# Patient Record
Sex: Female | Born: 1955 | Race: White | Hispanic: No | Marital: Single | Smoking: Never smoker
Health system: Southern US, Community
[De-identification: ages and names within clinical notes are randomized; demographics above are authoritative.]

## PROBLEM LIST (undated history)

## (undated) DIAGNOSIS — C801 Malignant (primary) neoplasm, unspecified: Secondary | ICD-10-CM

## (undated) DIAGNOSIS — R011 Cardiac murmur, unspecified: Secondary | ICD-10-CM

## (undated) DIAGNOSIS — K219 Gastro-esophageal reflux disease without esophagitis: Secondary | ICD-10-CM

## (undated) DIAGNOSIS — A389 Scarlet fever, uncomplicated: Secondary | ICD-10-CM

## (undated) DIAGNOSIS — E785 Hyperlipidemia, unspecified: Secondary | ICD-10-CM

## (undated) DIAGNOSIS — E079 Disorder of thyroid, unspecified: Secondary | ICD-10-CM

## (undated) DIAGNOSIS — F329 Major depressive disorder, single episode, unspecified: Secondary | ICD-10-CM

## (undated) DIAGNOSIS — B019 Varicella without complication: Secondary | ICD-10-CM

## (undated) DIAGNOSIS — F32A Depression, unspecified: Secondary | ICD-10-CM

## (undated) DIAGNOSIS — M1990 Unspecified osteoarthritis, unspecified site: Secondary | ICD-10-CM

## (undated) DIAGNOSIS — K5792 Diverticulitis of intestine, part unspecified, without perforation or abscess without bleeding: Secondary | ICD-10-CM

## (undated) DIAGNOSIS — T7840XA Allergy, unspecified, initial encounter: Secondary | ICD-10-CM

## (undated) HISTORY — DX: Gastro-esophageal reflux disease without esophagitis: K21.9

## (undated) HISTORY — DX: Major depressive disorder, single episode, unspecified: F32.9

## (undated) HISTORY — DX: Scarlet fever, uncomplicated: A38.9

## (undated) HISTORY — DX: Malignant (primary) neoplasm, unspecified: C80.1

## (undated) HISTORY — DX: Disorder of thyroid, unspecified: E07.9

## (undated) HISTORY — DX: Cardiac murmur, unspecified: R01.1

## (undated) HISTORY — DX: Diverticulitis of intestine, part unspecified, without perforation or abscess without bleeding: K57.92

## (undated) HISTORY — DX: Varicella without complication: B01.9

## (undated) HISTORY — DX: Allergy, unspecified, initial encounter: T78.40XA

## (undated) HISTORY — DX: Depression, unspecified: F32.A

## (undated) HISTORY — DX: Unspecified osteoarthritis, unspecified site: M19.90

## (undated) HISTORY — DX: Hyperlipidemia, unspecified: E78.5

---
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
## 2002-06-13 HISTORY — PX: BREAST SURGERY: SHX581

## 2016-11-11 HISTORY — PX: NOSE SURGERY: SHX723

## 2017-08-02 ENCOUNTER — Ambulatory Visit: Payer: BLUE CROSS/BLUE SHIELD | Admitting: Family Medicine

## 2017-08-02 ENCOUNTER — Encounter: Payer: Self-pay | Admitting: Family Medicine

## 2017-08-02 VITALS — BP 126/83 | HR 73 | Temp 98.2°F | Resp 12 | Ht 65.0 in | Wt 214.2 lb

## 2017-08-02 DIAGNOSIS — Z6835 Body mass index (BMI) 35.0-35.9, adult: Secondary | ICD-10-CM

## 2017-08-02 DIAGNOSIS — F39 Unspecified mood [affective] disorder: Secondary | ICD-10-CM

## 2017-08-02 DIAGNOSIS — M545 Low back pain: Secondary | ICD-10-CM

## 2017-08-02 DIAGNOSIS — G8929 Other chronic pain: Secondary | ICD-10-CM | POA: Diagnosis not present

## 2017-08-02 DIAGNOSIS — E6609 Other obesity due to excess calories: Secondary | ICD-10-CM | POA: Diagnosis not present

## 2017-08-02 DIAGNOSIS — G4700 Insomnia, unspecified: Secondary | ICD-10-CM | POA: Diagnosis not present

## 2017-08-02 DIAGNOSIS — E669 Obesity, unspecified: Secondary | ICD-10-CM | POA: Insufficient documentation

## 2017-08-02 MED ORDER — OMEPRAZOLE 40 MG PO CPDR: 40.0000 mg | DELAYED_RELEASE_CAPSULE | Freq: Two times a day (BID) | ORAL | 1 refills | Status: AC

## 2017-08-02 MED ORDER — BACLOFEN 10 MG PO TABS: 10.0000 mg | ORAL_TABLET | ORAL | 0 refills | Status: DC | PRN

## 2017-08-02 MED ORDER — LIRAGLUTIDE -WEIGHT MANAGEMENT 18 MG/3ML ~~LOC~~ SOPN: 3.0000 mg | PEN_INJECTOR | Freq: Once | SUBCUTANEOUS | 4 refills | Status: AC

## 2017-08-02 MED ORDER — IPRATROPIUM BROMIDE 0.06 % NA SOLN: 2.0000 | Freq: Four times a day (QID) | NASAL | 12 refills | Status: DC

## 2017-08-02 MED ORDER — ATORVASTATIN CALCIUM 40 MG PO TABS: 40.0000 mg | ORAL_TABLET | Freq: Every day | ORAL | 2 refills | Status: DC

## 2017-08-02 NOTE — Assessment & Plan Note (Signed)
Stable. No changes in current management. Referral to ortho placed as requested.Marland Kitchen

## 2017-08-02 NOTE — Progress Notes (Signed)
HPI:   Jessica Newman is a 62 y.o. female, who is here today to establish care.  Former PCP: N/A She just moved to the area from [place] preventive routine visit: 5 years ago. [place]  Chronic medical problems: mood disorder,back pain,HLD,insomnia, and obesity among some.  She was following with psychiatrist , needs to establish with one here in this ear. She is on Seroquel 50 mg daily,Abilify 10 mg daily (according to pt,started to treat tics:lip biting), and Lamictal 100 mg daily. Insomnia, she sleeps about 2-3 hours at the time,she wakes up several times at night.   Obesity:  She has been on Saxenda 3 mg [place] daily since 01/2017,she was planning on bariatric surgery before starting medication.. 27 lb wt loss since medication started. She is following with [facility] 3 months and with nutritionist. She wants me to continue prescribing medication,so she does not have to go every 3 months, 1.5 hour drive.  HLD: On Lipitor 40 mg daily. Tolerating medication well.  She is following low fat diet.  Planning on resuming regular exercise.  Low back pain:  Chronic. She has been following with ortho, [facility]. Currently on Hydrocodone-Acetaminophen 5-325 mg daily as needed. Also on Baclofen 10 mg daily. Lower back pain,L>R, "really bad" sometimes. She recently recently steroid injection, ? Epidural. Sharp pain, 8/10,sometimes radiated to RLE. She denies saddle anesthesia,urine or bowel incontinence,numbness or tingling.  Requesting referral to ortho.  Rhinitis:.  Constant clear rhinorrhea for years. She has not identified exacerbating or alleviating factors. She wonders if she needs to see immunologist. Medication.  GERD:  She is on Omeprazole 40 mg bid. She has no symptoms as far as she takes   Denies abdominal pain, nausea, vomiting, changes in bowel habits, blood in stool or melena.    Review of Systems  Constitutional:  Positive for fatigue. Negative for activity change, appetite change and fever.  HENT: Negative for mouth sores, nosebleeds and trouble swallowing.   Eyes: Negative for redness and visual disturbance.  Respiratory: Negative for cough, shortness of breath and wheezing.   Cardiovascular: Negative for chest pain, palpitations and leg swelling.  Gastrointestinal: Negative for abdominal pain, nausea and vomiting.       Negative for changes in bowel habits.  Endocrine: Negative for cold intolerance, heat intolerance, polydipsia, polyphagia and polyuria.  Genitourinary: Negative for decreased urine volume, dysuria and hematuria.  Musculoskeletal: Positive for back pain. Negative for gait problem.  Skin: Negative for rash.  Neurological: Negative for syncope, weakness and headaches.  Psychiatric/Behavioral: Positive for sleep disturbance. Negative for confusion and hallucinations. The patient is nervous/anxious.       Current Outpatient Medications on File Prior to Visit  Medication Sig Dispense Refill  . ARIPiprazole (ABILIFY) 10 MG tablet Take 10 mg by mouth daily.    Marland Kitchen HYDROcodone-acetaminophen (NORCO/VICODIN) 5-325 MG tablet Take 1 tablet by mouth as needed for moderate pain.    Marland Kitchen lamoTRIgine (LAMICTAL) 100 MG tablet Take 100 mg by mouth daily.    . naproxen (NAPROSYN) 375 MG tablet Take 375 mg by mouth daily.    . QUEtiapine (SEROQUEL) 50 MG tablet Take 50 mg by mouth at bedtime.     No current facility-administered medications on file prior to visit.      Past Medical History:  Diagnosis Date  . Allergy   . Arthritis   . Cancer ([place])   . Chicken pox   . Depression   .  Diverticulitis   . GERD (gastroesophageal reflux disease)   . Heart murmur   . Hyperlipidemia    Allergies  Allergen Reactions  . Pollen Extract Other (See Comments)    Sneezing, cough, watery eyes  . Dust Mite Extract Cough  . Mold Extract [Trichophyton] Other (See Comments)    Cough, sneezing, rash    No  family history on file.  Social History   Socioeconomic History  . Marital status: Single    Spouse name: None  . Number of children: None  . Years of education: None  . Highest education level: None  Social Needs  . Financial resource strain: None  . Food insecurity - worry: None  . Food insecurity - inability: None  . Transportation needs - medical: None  . Transportation needs - non-medical: None  Occupational History  . None  Tobacco Use  . Smoking status: Never Smoker  . Smokeless tobacco: Never Used  Substance and Sexual Activity  . Alcohol use: Yes  . Drug use: No  . Sexual activity: No  Other Topics Concern  . None  Social History Narrative  . None    Vitals:   08/02/17 0758  BP: 126/83  Pulse: 73  Resp: 12  Temp: 98.2 F (36.8 C)  SpO2: 97%    Body mass index is 35.65 kg/m.   Physical Exam  Nursing note and vitals reviewed. Constitutional: She is oriented to person, place, and time. She appears well-developed. No distress.  HENT:  Head: Normocephalic and atraumatic.  Mouth/Throat: Oropharynx is clear and moist and mucous membranes are normal.  Eyes: Conjunctivae are normal. Pupils are equal, round, and reactive to light.  Neck: No tracheal deviation present. No thyroid mass and no thyromegaly present.  Cardiovascular: Normal rate and regular rhythm.  No murmur heard. Pulses:      Dorsalis pedis pulses are 2+ on the right side, and 2+ on the left side.  Respiratory: Effort normal and breath sounds normal. No respiratory distress.  GI: Soft. She exhibits no mass. There is no hepatomegaly. There is no tenderness.  Musculoskeletal: She exhibits no edema.  Lymphadenopathy:    She has no cervical adenopathy.  Neurological: She is alert and oriented to person, place, and time. She has normal strength. Coordination normal.  Skin: Skin is warm. No erythema.  Psychiatric: She has a normal mood and affect.  Well groomed, good eye contact.     ASSESSMENT AND PLAN:    Jessica Newman was seen today for establish care.  Diagnoses and all orders for this visit:   Insomnia disorder Good sleep hygiene recommended. Options discussed in regard to pharmacologic treatment. She will increase Seroquel from 50 mg to 100 mg and will let me know in 1-2 weeks if it is helping. She will continue following with psychiatrist.  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 No changes in Abilify or Lamictal. Seroquel increased from 50 mg to 100 mg. Instructed about warning signs.  Class 2 obesity with body mass index (BMI) of 35.0 to 35.9 in adult We discussed benefits of wt loss as well as adverse effects of obesity. Consistency with healthy diet and physical activity recommended. Planning on resuming exercise. She will continue current treatment,Saxenda 3 mg [place] daily, some side effects discussed. She will also continue following with nutritionist periodically.  Chronic low back pain Stable. No changes in current management. Referral to ortho placed as requested.Marland Kitchen  Ladarrell Cornwall G. Martinique, MD  [facility]. [place] office.

## 2017-08-02 NOTE — Patient Instructions (Signed)
A few things to remember from today's visit:   Chronic low back pain, unspecified back pain laterality, with sciatica presence unspecified - Plan: Ambulatory referral to Orthopedic Surgery  PSYCHIATRICS OFFICES AND PSYCHIATRISTS IN THE AREA   When psychiatric evaluation is discussed or recommended referral is not necessary. This is a list of options around [place], [place] that you can call and arrange an appointment.  -[facility] [phone] [place] [phone] [facility] [phone] -[facility] [phone]  Dr Hardie Shackleton [phone] Dr Alexander Bergeron, MD [phone] Dr Chucky May, MD [phone])   Dr Allayne Butcher. Marcelino Freestone, MD [phone] Dr Geralyn Flash A. Lugo  Dr Sheralyn Boatman, MD [phone])  Dr Fredonia Highland, MD [phone]   Please be sure medication list is accurate. If a new problem present, please set up appointment sooner than planned today.

## 2017-08-02 NOTE — Assessment & Plan Note (Signed)
A list of psychiatrists in the area given,so she can call and establish with one. No changes in Abilify or Lamictal. Seroquel increased from 50 mg to 100 mg. Instructed about warning signs.

## 2017-08-02 NOTE — Assessment & Plan Note (Signed)
Good sleep hygiene recommended. Options discussed in regard to pharmacologic treatment. She will increase Seroquel from 50 mg to 100 mg and will let me know in 1-2 weeks if it is helping. She will continue following with psychiatrist.

## 2017-08-02 NOTE — Assessment & Plan Note (Signed)
We discussed benefits of wt loss as well as adverse effects of obesity. Consistency with healthy diet and physical activity recommended. Planning on resuming exercise. She will continue current treatment,Saxenda 3 mg [place] daily, some side effects discussed. She will also continue following with nutritionist periodically.

## 2017-08-08 ENCOUNTER — Encounter: Payer: Self-pay | Admitting: Family Medicine

## 2017-08-10 NOTE — Telephone Encounter (Signed)
Copied from [place]. Topic: Quick Communication - See Telephone Encounter >> Aug 10, 2017  2:05 PM Lolita Rieger, RMA wrote: CRM for notification. See Telephone encounter for:   08/10/17.pt called looking for a response for an mychart email sent on 08/08/17 please call pt [phone]

## 2017-08-13 ENCOUNTER — Encounter: Payer: Self-pay | Admitting: Family Medicine

## 2017-08-14 ENCOUNTER — Telehealth: Payer: Self-pay | Admitting: *Deleted

## 2017-08-14 NOTE — Telephone Encounter (Signed)
Dr. Martinique,  The [place] prescription you filled is not covered by my current insurance and it won't be until May before I change insurance that will cover it.  In the meantime, is there another appetite suppressant/medication you can prescribe that will help with weight loss. It's urgent and important that I continue losing weight.   Many thanks,  Jessica Newman  [phone]

## 2017-08-16 ENCOUNTER — Other Ambulatory Visit: Payer: Self-pay | Admitting: Family Medicine

## 2017-08-16 DIAGNOSIS — Z85828 Personal history of other malignant neoplasm of skin: Secondary | ICD-10-CM | POA: Insufficient documentation

## 2017-08-16 NOTE — Telephone Encounter (Signed)
This was already addressed, no further recommendations at this time other than those already given (08/08/17).  Thanks, BJ.

## 2017-08-16 NOTE — Telephone Encounter (Signed)
Patient given instructions per Dr. Martinique, patient verbalized understanding.

## 2017-08-24 ENCOUNTER — Telehealth: Payer: Self-pay | Admitting: Family Medicine

## 2017-08-24 NOTE — Telephone Encounter (Signed)
quetiapinel refill Last OV: 08/02/17 Last Refill: 08/02/17 Pharmacy:[facility] [place], [place]  Refill aripiprazole Last OV: 08/02/17 Last Refill: 08/02/17 [facility] [place] [place]

## 2017-08-24 NOTE — Telephone Encounter (Signed)
Copied from [place] [id number]. Topic: General - Other >> Aug 24, 2017  8:24 AM Darl Householder, RMA wrote: Reason for CRM: Medication refill request for ARIPiprazole (ABILIFY) 10 MG and QUEtiapine (SEROQUEL) 50 MG to be [facility] to [facility]

## 2017-08-25 ENCOUNTER — Ambulatory Visit (INDEPENDENT_AMBULATORY_CARE_PROVIDER_SITE_OTHER): Payer: BLUE CROSS/BLUE SHIELD | Admitting: Specialist

## 2017-08-25 ENCOUNTER — Ambulatory Visit (INDEPENDENT_AMBULATORY_CARE_PROVIDER_SITE_OTHER): Payer: BLUE CROSS/BLUE SHIELD

## 2017-08-25 ENCOUNTER — Encounter (INDEPENDENT_AMBULATORY_CARE_PROVIDER_SITE_OTHER): Payer: Self-pay | Admitting: Specialist

## 2017-08-25 VITALS — BP 126/72 | HR 68 | Ht 65.0 in | Wt 219.0 lb

## 2017-08-25 DIAGNOSIS — M5136 Other intervertebral disc degeneration, lumbar region: Secondary | ICD-10-CM

## 2017-08-25 DIAGNOSIS — M545 Low back pain: Secondary | ICD-10-CM

## 2017-08-25 MED ORDER — BACLOFEN 10 MG PO TABS: 10.0000 mg | ORAL_TABLET | Freq: Three times a day (TID) | ORAL | 0 refills | Status: DC

## 2017-08-25 MED ORDER — DICLOFENAC SODIUM 75 MG PO TBEC: 75.0000 mg | DELAYED_RELEASE_TABLET | Freq: Two times a day (BID) | ORAL | 3 refills | Status: DC

## 2017-08-25 MED ORDER — TRAMADOL-ACETAMINOPHEN 37.5-325 MG PO TABS: 1.0000 | ORAL_TABLET | Freq: Four times a day (QID) | ORAL | 0 refills | Status: DC | PRN

## 2017-08-25 NOTE — Addendum Note (Signed)
Addended by: Basil Dess on: 08/25/2017 10:50 AM   Modules accepted: Orders

## 2017-08-25 NOTE — Progress Notes (Signed)
Office Visit Note   Patient: Jessica Newman           Date of Birth: 15-Mar-1956           MRN: [id number] Visit Date: 08/25/2017              Requested by: Martinique, Betty G, MD [address] [place], [place] [address] PCP: Martinique, Betty G, MD   Assessment & Plan: Visit Diagnoses:  1. Low back pain, unspecified back pain laterality, unspecified chronicity, with sciatica presence unspecified   2. Degenerative disc disease, lumbar   3. Other intervertebral disc degeneration, lumbar region     Plan: Avoid frequent bending and stooping  No lifting greater than 10 lbs. May use ice or moist heat for pain. Weight loss is of benefit. Handicap license is approved.  Follow-Up Instructions: Return in about 4 weeks (around 09/22/2017).   Orders:  Orders Placed This Encounter  Procedures  . XR Lumbar Spine 2-3 Views  . MR Lumbar Spine w/o contrast   Meds ordered this encounter  Medications  . diclofenac (VOLTAREN) 75 MG EC tablet    Sig: Take 1 tablet (75 mg total) by mouth 2 (two) times daily.    Dispense:  60 tablet    Refill:  3  . traMADol-acetaminophen (ULTRACET) 37.5-325 MG tablet    Sig: Take 1 tablet by mouth every 6 (six) hours as needed.    Dispense:  30 tablet    Refill:  0      Procedures: No procedures performed   Clinical Data: No additional findings.   Subjective: Chief Complaint  Patient presents with  . Lower Back - Pain    62 year old female with about a 2 year history fo low back and transverse upper buttock pain right greater than left. She reports it began with back stiffness and discomfort That sometimes is as high, she has been moving her niece [place] to law school in [place]. In the am difficulty getting to her feet for shoes and socks and she has to pull her  Pants leg up to get to her feet. In the past has been flexible, did gain some weight recently but  20 lbs. No numbness or tingling. She is not walking a distance but can do most things  during the day, lying on the side does not help, has a new mattress that is firm with a Memory overlay. Takes a half of a hydrocodone to relieve pain. She  Does not travel as much as she used to.  Has to wiggle around or scoot around a lot when sitting or diriving. No bowel or bladder difficulty.  Does notice pain with lifting . Mopping, sweeping, yard work with associated. She has it affecting her sleep, the pain in the lower back keeps her awake at night. Getting up to go to the bathroom kis sometime a problem. Gets 3 hours then up one hour then another 3 hours. Lives alone. Most of her family is in [place], [place] and a brother in [place], [place]. She takes Naprosyn, prescription strength, 375 mg one tablet at night. When it does not work I take hydrocodone. Is taking baclofen for restless leg and in the past has taken flexeril.     Review of Systems  Constitutional: Positive for activity change and unexpected weight change.  HENT: Positive for rhinorrhea. Negative for congestion, dental problem, drooling, ear discharge, ear pain, facial swelling, hearing loss, mouth sores, nosebleeds, postnasal drip, sinus pressure, sinus  pain, sneezing, sore throat, tinnitus and voice change.   Eyes: Negative.  Negative for photophobia, pain, discharge, redness, itching and visual disturbance.  Respiratory: Positive for cough and choking. Negative for apnea, chest tightness, shortness of breath, wheezing and stridor.   Cardiovascular: Negative.  Negative for chest pain, palpitations and leg swelling.  Gastrointestinal: Negative.  Negative for abdominal distention, abdominal pain, anal bleeding, blood in stool and constipation.  Endocrine: Negative.  Negative for cold intolerance, heat intolerance, polydipsia and polyphagia.  Genitourinary: Negative.  Negative for difficulty urinating, dyspareunia, dysuria, enuresis, flank pain, frequency and hematuria.  Musculoskeletal: Positive for arthralgias, back pain, gait  problem and joint swelling. Negative for myalgias, neck pain and neck stiffness.  Skin: Positive for rash. Negative for color change, pallor and wound.  Allergic/Immunologic: Negative.  Negative for environmental allergies, food allergies and immunocompromised state.  Neurological: Negative for dizziness, seizures, syncope, facial asymmetry, speech difficulty, weakness, light-headedness, numbness and headaches.  Hematological: Negative.  Negative for adenopathy. Does not bruise/bleed easily.  Psychiatric/Behavioral: Negative.  Negative for agitation, behavioral problems, confusion, decreased concentration, dysphoric mood, hallucinations, self-injury, sleep disturbance and suicidal ideas. The patient is not nervous/anxious and is not hyperactive.      Objective: Vital Signs: BP 126/72 (BP Location: Left Arm, Patient Position: Sitting)   Pulse 68   Ht 5\' 5"  (1.651 m)   Wt 219 lb (99.3 kg)   BMI 36.44 kg/m   Physical Exam  Constitutional: She is oriented to person, place, and time. She appears well-developed and well-nourished.  HENT:  Head: Normocephalic and atraumatic.  Eyes: EOM are normal. Pupils are equal, round, and reactive to light.  Neck: Normal range of motion. Neck supple.  Pulmonary/Chest: Effort normal and breath sounds normal.  Abdominal: Soft. Bowel sounds are normal.  Neurological: She is alert and oriented to person, place, and time.  Skin: Skin is warm and dry.  Psychiatric: She has a normal mood and affect. Her behavior is normal. Judgment and thought content normal.    Back Exam   Tenderness  The patient is experiencing tenderness in the lumbar.  Range of Motion  Extension:  20 abnormal  Flexion:  90 normal  Lateral bend right: normal  Lateral bend left: normal  Rotation right: normal  Rotation left: normal   Muscle Strength  Right Quadriceps:  5/5  Left Quadriceps:  5/5  Right Hamstrings:  5/5  Left Hamstrings:  5/5   Tests  Straight leg raise  right: negative Straight leg raise left: negative  Reflexes  Patellar: normal Achilles: normal Biceps: normal Babinski's sign: normal   Other  Toe walk: normal Heel walk: normal Sensation: normal Gait: normal  Erythema: no back redness Scars: absent      Specialty Comments:  No specialty comments available.  Imaging: Xr Lumbar Spine 2-3 Views  Result Date: 08/25/2017 AP and lateral flexion and extension radiographs of the lumbar spine show moderated degenerative disc narrowing L1-2 and L2-3, mild at L3-4 and L4-5. Minimal retrolisthesis L3-4 with extension reduces with flexion, minimal lateral spurs  Left L1-2 through L4-5    PMFS History: Patient Active Problem List   Diagnosis Date Noted  . History of basal cell carcinoma 08/16/2017  . 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 Class 2 obesity with body mass index (BMI) of 35.0 to 35.9 in adult 08/02/2017  . Chronic low back pain 08/02/2017   Past Medical History:  Diagnosis Date  . Allergy   .  Arthritis   . Cancer ([place])   . Chicken pox   . Depression   . Diverticulitis   . GERD (gastroesophageal reflux disease)   . Heart murmur   . Hyperlipidemia   . Scarlet fever   . Thyroid condition     History reviewed. No pertinent family history.  Past Surgical History:  Procedure Laterality Date  . APPENDECTOMY  1976  . BREAST SURGERY  2004  . NOSE SURGERY  11/2016   Social History   Occupational History  . Not on file  Tobacco Use  . Smoking status: Never Smoker  . Smokeless tobacco: Never Used  Substance and Sexual Activity  . Alcohol use: Yes  . Drug use: No  . Sexual activity: No

## 2017-08-25 NOTE — Patient Instructions (Signed)
Avoid frequent bending and stooping  No lifting greater than 10 lbs. May use ice or moist heat for pain. Weight loss is of benefit. Handicap license is approved.   

## 2017-08-28 NOTE — Telephone Encounter (Signed)
Pt following up on refill request for ARIPiprazole (ABILIFY) 10 MG tablet QUEtiapine (SEROQUEL) 50 MG tablet

## 2017-08-29 ENCOUNTER — Other Ambulatory Visit: Payer: Self-pay | Admitting: Family Medicine

## 2017-08-29 MED ORDER — ARIPIPRAZOLE 10 MG PO TABS: 10.0000 mg | ORAL_TABLET | Freq: Every day | ORAL | 0 refills | Status: AC

## 2017-08-29 NOTE — Telephone Encounter (Signed)
I sent Abilify 10 mg daily. In regard to Seroquel is she still taking 50 mg or did she increase dose to 100 mg?  Thanks, BJ

## 2017-08-30 MED ORDER — QUETIAPINE FUMARATE 100 MG PO TABS: 100.0000 mg | ORAL_TABLET | Freq: Every day | ORAL | 0 refills | Status: AC

## 2017-08-30 NOTE — Telephone Encounter (Signed)
Spoke with patient and she stated that she has been looking into two of the psychiatrist that were recommended and she would be making her decision by the end of the week. Patient informed that Rx's were sent to pharmacy.

## 2017-08-30 NOTE — Telephone Encounter (Signed)
Pt following up on this request. Pt states she has increased to 100 mg.   2 of the 50 mg at bedtime.  [id number] [place] [place] [address] [address] [phone] (Phone) [phone] (Fax)

## 2017-08-30 NOTE — Telephone Encounter (Signed)
Rx for Seroquel 100 mg to continue 1 tablet at bedtime sent to her pharmacy. Remind patient to establish with psychiatrist, last visit I gave her a list of providers in the area.  Thanks, BJ

## 2017-08-30 NOTE — Telephone Encounter (Signed)
Left message for patient to give clinic a call back with correct dose of Seroquel.

## 2017-09-06 ENCOUNTER — Other Ambulatory Visit: Payer: BLUE CROSS/BLUE SHIELD

## 2017-09-09 ENCOUNTER — Encounter (INDEPENDENT_AMBULATORY_CARE_PROVIDER_SITE_OTHER): Payer: Self-pay | Admitting: Specialist

## 2017-09-22 ENCOUNTER — Other Ambulatory Visit (INDEPENDENT_AMBULATORY_CARE_PROVIDER_SITE_OTHER): Payer: Self-pay | Admitting: Specialist

## 2017-09-25 NOTE — Telephone Encounter (Signed)
Ultracet refill request 

## 2017-09-26 ENCOUNTER — Ambulatory Visit
Admission: RE | Admit: 2017-09-26 | Discharge: 2017-09-26 | Disposition: A | Discharge status: Home / Self Care | Payer: BLUE CROSS/BLUE SHIELD | Source: Ambulatory Visit | Attending: Specialist | Admitting: Specialist

## 2017-09-26 DIAGNOSIS — M5136 Other intervertebral disc degeneration, lumbar region: Secondary | ICD-10-CM

## 2017-09-26 NOTE — Telephone Encounter (Signed)
[facility] 

## 2017-10-05 ENCOUNTER — Encounter (INDEPENDENT_AMBULATORY_CARE_PROVIDER_SITE_OTHER): Payer: Self-pay

## 2017-10-05 ENCOUNTER — Telehealth (INDEPENDENT_AMBULATORY_CARE_PROVIDER_SITE_OTHER): Payer: Self-pay | Admitting: Specialist

## 2017-10-05 ENCOUNTER — Ambulatory Visit (INDEPENDENT_AMBULATORY_CARE_PROVIDER_SITE_OTHER): Payer: BLUE CROSS/BLUE SHIELD | Admitting: Specialist

## 2017-10-05 ENCOUNTER — Encounter (INDEPENDENT_AMBULATORY_CARE_PROVIDER_SITE_OTHER): Payer: Self-pay | Admitting: Specialist

## 2017-10-05 VITALS — BP 126/71 | HR 72 | Ht 69.0 in | Wt 219.0 lb

## 2017-10-05 DIAGNOSIS — Z5321 Procedure and treatment not carried out due to patient leaving prior to being seen by health care provider: Secondary | ICD-10-CM

## 2017-10-05 NOTE — Telephone Encounter (Signed)
Pt called and would like to change provider

## 2017-10-06 ENCOUNTER — Encounter: Payer: Self-pay | Admitting: Family Medicine

## 2017-10-09 ENCOUNTER — Encounter (INDEPENDENT_AMBULATORY_CARE_PROVIDER_SITE_OTHER): Payer: Self-pay | Admitting: Specialist

## 2017-10-09 NOTE — Telephone Encounter (Signed)
Could you please review patient notes and advise if you are willing to resume care?

## 2017-10-09 NOTE — Telephone Encounter (Signed)
Can you ask Dr. Lorin Mercy if he would be willing to take this patient on?  She had an MRI done, and she came in to review it but she had to leave for another appt, and she is now requesting to see another Dr.

## 2017-10-09 NOTE — Telephone Encounter (Signed)
Call pt. See if he can see her 1st pt so she does not have to wait.

## 2017-10-09 NOTE — Progress Notes (Signed)
Patient's appointment was cancelled and rescheduled. Jessica Newman

## 2017-10-09 NOTE — Telephone Encounter (Signed)
Please see message from Dr. Lorin Mercy below.

## 2017-10-10 NOTE — Telephone Encounter (Signed)
Correction appt is on Friday 10/20/17 at 345pm.

## 2017-10-10 NOTE — Telephone Encounter (Signed)
I called and spoke with patient and she says that both visits with Dr [name] were so long, and she really wants to see Dr Lorin Mercy.  Appt made 10/17/17 at 345 pm, end of day.  Patient will call us before she comes in, to check and see if Lorin Mercy is on time with the schedule that day. I hope where I worked her in is ok. I tried to find the best spot.

## 2017-10-10 NOTE — Telephone Encounter (Signed)
Fyi.  Please see message below.

## 2017-10-18 ENCOUNTER — Telehealth: Payer: Self-pay | Admitting: Family Medicine

## 2017-10-18 NOTE — Telephone Encounter (Signed)
Copied from [place] [id number]. Topic: Quick Communication - See Telephone Encounter >> Oct 18, 2017  3:21 PM Vernona Rieger wrote: CRM for notification. See Telephone encounter for: 10/18/17.  Trent from [facility] called and needs some additional information on Liraglutide -Weight Management (SAXENDA) 18 MG/3ML SOPN.  Call back is [phone]   He will be faxing the forms to the office as well

## 2017-10-20 ENCOUNTER — Encounter (INDEPENDENT_AMBULATORY_CARE_PROVIDER_SITE_OTHER): Payer: Self-pay | Admitting: Orthopaedic Surgery

## 2017-10-20 ENCOUNTER — Ambulatory Visit (INDEPENDENT_AMBULATORY_CARE_PROVIDER_SITE_OTHER): Payer: BLUE CROSS/BLUE SHIELD | Admitting: Orthopaedic Surgery

## 2017-10-20 VITALS — BP 139/78 | HR 80 | Ht 65.0 in | Wt 220.0 lb

## 2017-10-20 DIAGNOSIS — M545 Low back pain: Secondary | ICD-10-CM | POA: Diagnosis not present

## 2017-10-20 MED ORDER — DICLOFENAC SODIUM 75 MG PO TBEC: 75.0000 mg | DELAYED_RELEASE_TABLET | Freq: Two times a day (BID) | ORAL | 2 refills | Status: DC

## 2017-10-20 NOTE — Telephone Encounter (Signed)
Unable to get in touch with [facility], will call back at a later time. Have not seen any faxes from them as of yet.

## 2017-10-23 ENCOUNTER — Encounter (INDEPENDENT_AMBULATORY_CARE_PROVIDER_SITE_OTHER): Payer: Self-pay | Admitting: Orthopaedic Surgery

## 2017-10-23 NOTE — Progress Notes (Signed)
Office Visit Note   Patient: Jessica Newman           Date of Birth: [birth year]           MRN: [id number] Visit Date: 10/20/2017              Requested by: Newman, Jessica G, MD [address] [place], [place] [address] PCP: Newman, Jessica G, MD   Assessment & Plan: Visit Diagnoses:  1. 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  This is likely her symptomatic level.  Discussed using diclofenac 1 p.o. twice daily with food..  We discussed avoiding narcotic medication and I would recommend a single epidural injection since she is Jessica Newman been on anti-inflammatories activity modification and has had persistent symptoms for greater than 4 months.  Plan to check her back after the epidural.  We reviewed the MRI images I gave her a copy of the report.  Pathophysiology discussed.  Follow-Up Instructions: No follow-ups on file.   Orders:  Orders Placed This Encounter  Procedures  . Ambulatory referral to Physical Medicine Rehab   Meds ordered this encounter  Medications  . diclofenac (VOLTAREN) 75 MG EC tablet    Sig: Take 1 tablet (75 mg total) by mouth 2 (two) times daily.    Dispense:  60 tablet    Refill:  2      Procedures: No procedures performed   Clinical Data: No additional findings.   Subjective: Chief Complaint  Patient presents with  . Lower Back - Pain    HPI 62 year old female previously seen by Dr. Louanne Newman is asked to transfer care to me concerning back pain and left leg pain.  Stiffness difficulty getting erect in the morning.  She does better after she been up walking.  She has been taking diclofenac 75 mg once a day also tramadol, she also has taken Naprosyn.  Discussed taking either Naprosyn or diclofenac and full dose and not combining them.  She is also occasionally use some Norco 10/14/2023  rarely.  Patient has lumbar disc degeneration and has had an MRI scan which is available for review.  She denies previous surgery no associated bowel or bladder symptoms.  She has increased symptoms with turning twisting bending.  Jessica Newman work makes it worse.  Frequently has to wake up with a melanite walk for period of time for she can fall back asleep.  Review of Systems 14 point review of systems updated unchanged from 08/25/2017 office visit other than as mentioned above.  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   Objective: Vital Signs: BP 139/78 (BP Location: Right Arm, Patient Position: Sitting, Cuff Size: Large)   Pulse 80   Ht 5\' 5"  (1.651 m)   Wt 220 lb (99.8 kg)   BMI 36.61 kg/m   Physical Exam  Constitutional: She is oriented to person, place, and time. She appears well-developed.  HENT:  Head: Normocephalic.  Right Ear: External ear normal.  Left Ear: External ear normal.  Eyes: Pupils are equal, round, and reactive to light.  Neck: No tracheal deviation present. No thyromegaly present.  Cardiovascular: Normal rate.  Pulmonary/Chest: Effort normal.  Abdominal: Soft.  Neurological: She is alert and oriented to person, place, and time.  Skin: Skin is warm and dry.  Psychiatric: She has a  normal mood and affect. Her behavior is normal.    Ortho Exam patient has discomfort with forward  bending at the waist fingers to mid tibia.  Lateral bending is not painful she does have good rotation.  No isolated motor weakness anterior tib gastrocsoleus quads hamstrings.  Negative straight leg raising 90 degrees.  Distal pulses are palpable.  She has left sciatic notch tenderness mild trochanteric bursal tenderness on the left negative on the right.  SI joint testing is negative negative logroll to the hips.  Specialty Comments:  No specialty comments available.  Imaging: CLINICAL DATA:  Low back pain and stiffness.  Symptoms for 3 years. Left hip pain. Abnormal x-ray.  EXAM: MRI LUMBAR SPINE WITHOUT CONTRAST  TECHNIQUE: Multiplanar, multisequence MR imaging of the lumbar spine was performed. No intravenous contrast was administered.  COMPARISON:  None.  FINDINGS: Segmentation:  5 lumbar type vertebral bodies.  Alignment:  Borderline levocurvature.  Vertebrae: No fracture, evidence of discitis, or aggressive bone lesion. Small hemangiomas in the T12, L2, and L3 bodies.  Conus medullaris and cauda equina: Conus extends to the L1 level. Conus and cauda equina appear normal.  Paraspinal and other soft tissues: Negative  Disc levels:  T12- L1: Unremarkable.  L1-L2: Spondylosis and mild disc narrowing.  No impingement  L2-L3: Spondylosis and mild disc narrowing. Mild degenerative facet spurring. No impingement  L3-L4: Spondylosis with mild disc bulging. Mild facet spurring. No impingement  L4-L5: Disc narrowing and bulging with left paracentral protrusion best seen on reformats. Degenerative posterior element hypertrophy. Mild-to-moderate spinal stenosis. Left more than right subarticular recess impingement on L5. Patent foramina  L5-S1:Degenerative facet spurring.  IMPRESSION: 1. Generalized overall mild disc degeneration. Degenerative facet spurring at L2-3 and below. 2. L4-5 mild to moderate spinal stenosis. Either L5 nerve root could be affected in the subarticular recesses, especially on the left where there is a paracentral disc protrusion. 3. No impingement elsewhere in the lumbar spine.   Electronically Signed   By: Monte Fantasia M.D.   On: 09/26/2017 08:21    PMFS History: Patient Active Problem List   Diagnosis Date Noted  . History of basal cell carcinoma 08/16/2017  . 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 Mood disorder in partial remission ([place]) 08/02/2017  . Class 2 obesity with body mass index (BMI) of 35.0 to 35.9 in adult 08/02/2017  .  Chronic low back pain 08/02/2017   Past Medical History:  Diagnosis Date  . Allergy   . Arthritis   . Cancer ([place])   . Chicken pox   . Depression   . Diverticulitis   . GERD (gastroesophageal reflux disease)   . Heart murmur   . Hyperlipidemia   . Scarlet fever   . Thyroid condition     No family history on file.  Past Surgical History:  Procedure Laterality Date  . APPENDECTOMY  1976  . BREAST SURGERY  2004  . NOSE SURGERY  11/2016   Social History   Occupational History  . Not on file  Tobacco Use  . Smoking status: Never Smoker  . Smokeless tobacco: Never Used  Substance and Sexual Activity  . Alcohol use: Yes  . Drug use: No  . Sexual activity: Never

## 2017-10-23 NOTE — Telephone Encounter (Signed)
Patient checking status on PA, pt states insurance needs to know more info on PA. PA # [phone]

## 2017-10-24 NOTE — Telephone Encounter (Signed)
I left a detailed message for Jessica Newman to return my call.

## 2017-10-26 ENCOUNTER — Encounter: Payer: Self-pay | Admitting: Family Medicine

## 2017-10-29 NOTE — Progress Notes (Signed)
HPI:   Jessica Newman is a 62 y.o. female, who is here today for 3 months follow up.  She was seen on 08/02/17, when she established care. Since her last visit she has followed with ortho, chronic lower back pain. She is having epidural injection next week.  Mood disorder and insomnia, she is on Lamictal 100 mg daily and Abilify 10 mg daily. She has already established with psychiatrist, Dr Jerline Pain. Last visit Seroquel was increased from 50 mg to 100 mg because still not able to sleep well. Her psychiatric is trying to wean it off,now she is on Seroquel 25 mg daily. Next appt in 2 months.  Obesity: She is following with [facility],currently she is on Phentermine. She was on Liraglutide 3 mg but her health insurance was not longer covering medication. Today she is requesting refills on Liraglutide 3 mg,states that she has a new insurance and it is now covered. She does not think Phentermine is helping as Liraglutide did. She is not exercising regularly. She has not been consistent with healthy diet, craves for candy at night.   She also mentions that she is "uncomfortable hot" at night, no sweats, this has been going on for a couple of months. She has not identified exacerbating or Alleviating factors. She is reporting recent blood work, TSH was done and in normal range.    Review of Systems  Constitutional: Negative for activity change, appetite change, fatigue and fever.  HENT: Negative for mouth sores, nosebleeds and trouble swallowing.   Eyes: Negative for redness and visual disturbance.  Respiratory: Negative for cough, shortness of breath and wheezing.   Cardiovascular: Negative for chest pain, palpitations and leg swelling.  Gastrointestinal: Negative for abdominal pain, nausea and vomiting.       Negative for changes in bowel habits.  Endocrine: Negative for cold intolerance, polydipsia, polyphagia and polyuria.  Genitourinary: Negative for decreased urine  volume and hematuria.  Skin: Negative for rash.  Neurological: Negative for syncope, weakness and headaches.      Current Outpatient Medications on File Prior to Visit  Medication Sig Dispense Refill  . ARIPiprazole (ABILIFY) 10 MG tablet Take 1 tablet (10 mg total) by mouth daily. 90 tablet 0  . ARMOUR THYROID 30 MG tablet   0  . atorvastatin (LIPITOR) 40 MG tablet Take 1 tablet (40 mg total) by mouth daily. 90 tablet 2  . diclofenac (VOLTAREN) 75 MG EC tablet Take 1 tablet (75 mg total) by mouth 2 (two) times daily. 60 tablet 2  . HYDROcodone-acetaminophen (NORCO/VICODIN) 5-325 MG tablet Take 1 tablet by mouth as needed for moderate pain.    Marland Kitchen lamoTRIgine (LAMICTAL) 100 MG tablet Take 100 mg by mouth daily.    Marland Kitchen omeprazole (PRILOSEC) 40 MG capsule Take 1 capsule (40 mg total) by mouth 2 (two) times daily. 180 capsule 1  . phentermine 37.5 MG capsule Take 37.5 mg by mouth daily.  0  . QUEtiapine (SEROQUEL) 100 MG tablet Take 1 tablet (100 mg total) by mouth at bedtime. 90 tablet 0   No current facility-administered medications on file prior to visit.      Past Medical History:  Diagnosis Date  . Allergy   . Arthritis   . Cancer ([place])   . Chicken pox   . Depression   . Diverticulitis   . GERD (gastroesophageal reflux disease)   . Heart murmur   . Hyperlipidemia   . Scarlet fever   . Thyroid  condition    Allergies  Allergen Reactions  . Pollen Extract Other (See Comments)    Sneezing, cough, watery eyes  . Dust Mite Extract Cough  . Mold Extract [Trichophyton] Other (See Comments)    Cough, sneezing, rash  . Tolterodine Other (See Comments)    Dry mouth Dry mouth Dry mouth   . Molds & Smuts Other (See Comments)    Runny nose Runny nose Runny nose     Social History   Socioeconomic History  . Marital status: Single    Spouse name: Not on file  . Number of children: Not on file  . Years of education: Not on file  . Highest education level: Not on file    Occupational History  . Not on file  Social Needs  . Financial resource strain: Not on file  . Food insecurity:    Worry: Not on file    Inability: Not on file  . Transportation needs:    Medical: Not on file    Non-medical: Not on file  Tobacco Use  . Smoking status: Never Smoker  . Smokeless tobacco: Never Used  Substance and Sexual Activity  . Alcohol use: Yes  . Drug use: No  . Sexual activity: Never  Lifestyle  . Physical activity:    Days per week: Not on file    Minutes per session: Not on file  . Stress: Not on file  Relationships  . Social connections:    Talks on phone: Not on file    Gets together: Not on file    Attends religious service: Not on file    Active member of club or organization: Not on file    Attends meetings of clubs or organizations: Not on file    Relationship status: Not on file  Other Topics Concern  . Not on file  Social History Narrative  . Not on file    Vitals:   10/30/17 0751  BP: 126/78  Pulse: 86  Resp: 12  Temp: 97.7 F (36.5 C)  SpO2: 98%   Body mass index is 38.19 kg/m.  Wt Readings from Last 3 Encounters:  10/30/17 229 lb 8 oz (104.1 kg)  10/20/17 220 lb (99.8 kg)  10/05/17 219 lb (99.3 kg)   08/02/17 Wt 214 Lb.    Physical Exam  Nursing note and vitals reviewed. Constitutional: She is oriented to person, place, and time. She appears well-developed. No distress.  HENT:  Head: Normocephalic and atraumatic.  Mouth/Throat: Oropharynx is clear and moist and mucous membranes are normal.  Eyes: Pupils are equal, round, and reactive to light. Conjunctivae are normal.  Cardiovascular: Normal rate and regular rhythm.  No murmur heard. Pulses:      Dorsalis pedis pulses are 2+ on the right side, and 2+ on the left side.  Respiratory: Effort normal and breath sounds normal. No respiratory distress.  GI: Soft. She exhibits no mass. There is no hepatomegaly. There is no tenderness.  Musculoskeletal: She exhibits no  edema.  Lymphadenopathy:    She has no cervical adenopathy.  Neurological: She is alert and oriented to person, place, and time. She has normal strength. Gait normal.  Skin: Skin is warm. No rash noted. No erythema.  Psychiatric: She has a normal mood and affect.  Well groomed, good eye contact.    ASSESSMENT AND PLAN:  Jessica Newman was seen today for follow-up.  Diagnoses and all orders for this visit:  Hot flashes   ? Menopausal. Other possible etiologies  discussed. Recent lab work done,per pt report,so no further wirk up done today. Non hormonal options discussed,Paxil and Effexor;she would like to try the latter one. Some side effects discussed. F/U in 3 months.  -     venlafaxine XR (EFFEXOR XR) 37.5 MG 24 hr capsule; Take 1 capsule (37.5 mg total) by mouth daily with breakfast.   Class 2 obesity with body mass index (BMI) of 35.0 to 35.9 in adult Gained about 15 pounds since her last visit in 07/2017. She will continue following with weight loss clinic for nutritional consultation and Phentermine follow up. Saxenda started today, 3 mg daily. Regular physical activity also recommended. Follow-up in 3 months.  Hypothyroidism According to patient, TSH was done recently on in normal range. No changes in current management. I asked her to fax lab results.      Michael Ventresca G. [place], MD  [facility]. [place] office.

## 2017-10-30 ENCOUNTER — Telehealth: Payer: Self-pay | Admitting: Family Medicine

## 2017-10-30 ENCOUNTER — Ambulatory Visit (INDEPENDENT_AMBULATORY_CARE_PROVIDER_SITE_OTHER): Payer: BLUE CROSS/BLUE SHIELD | Admitting: Family Medicine

## 2017-10-30 ENCOUNTER — Encounter: Payer: Self-pay | Admitting: Family Medicine

## 2017-10-30 VITALS — BP 126/78 | HR 86 | Temp 97.7°F | Resp 12 | Ht 65.0 in | Wt 229.5 lb

## 2017-10-30 DIAGNOSIS — E6609 Other obesity due to excess calories: Secondary | ICD-10-CM | POA: Diagnosis not present

## 2017-10-30 DIAGNOSIS — Z6835 Body mass index (BMI) 35.0-35.9, adult: Secondary | ICD-10-CM | POA: Diagnosis not present

## 2017-10-30 DIAGNOSIS — E039 Hypothyroidism, unspecified: Secondary | ICD-10-CM | POA: Insufficient documentation

## 2017-10-30 DIAGNOSIS — R232 Flushing: Secondary | ICD-10-CM | POA: Diagnosis not present

## 2017-10-30 MED ORDER — LIRAGLUTIDE -WEIGHT MANAGEMENT 18 MG/3ML ~~LOC~~ SOPN: 3.0000 mg | PEN_INJECTOR | Freq: Every day | SUBCUTANEOUS | 2 refills | Status: AC

## 2017-10-30 MED ORDER — VENLAFAXINE HCL ER 37.5 MG PO CP24
37.5000 mg | ORAL_CAPSULE | Freq: Every day | ORAL | 1 refills | Status: DC
Start: 2017-10-30 — End: 2017-11-28

## 2017-10-30 NOTE — Telephone Encounter (Signed)
Jessica Newman, Jessica Newman  10/30/17 3:33 PM  Note    Spoke with patient and she stated that she was told by [facility] to reopen PA, call [phone], option 3 and ask for a Pier to [place]. She stated that she has tried Belviq almost a year ago and she has a BMI of 27+ with high cholesterol that is controlled with medication. Patient stated that she can be reached at [phone] for any questions.

## 2017-10-30 NOTE — Telephone Encounter (Signed)
Patient came into the office for a follow-up and she asked what questions [employer] needed answered so that she could get her medication. Informed patient that I was unsure because I do not do the PA's and told her that I would try to find out what is going on from the person that does the PA's in the office.

## 2017-10-30 NOTE — Assessment & Plan Note (Signed)
Gained about 15 pounds since her last visit in 07/2017. She will continue following with weight loss clinic for nutritional consultation and Phentermine follow up. Saxenda started today, 3 mg daily. Regular physical activity also recommended. Follow-up in 3 months.

## 2017-10-30 NOTE — Telephone Encounter (Signed)
Spoke with patient and she stated that she was told by [facility] to reopen PA, call [phone], option 3 and ask for a Pier to [place]. She stated that she has tried Belviq almost a year ago and she has a BMI of 27+ with high cholesterol that is controlled with medication. Patient stated that she can be reached at [phone] for any questions.

## 2017-10-30 NOTE — Telephone Encounter (Signed)
I called [employer] at [phone] and spoke with Destiny T.  She stated the request was received on 5/6 and denied on 5/14.  She stated the reason for denial was: the pt must have tried Qysmia, Belviq, and have a BMI of 30 or more. I called the pt and informed her of this information and note was forwarded to Dr Doug Sou asst

## 2017-10-30 NOTE — Assessment & Plan Note (Signed)
According to patient, TSH was done recently on in normal range. No changes in current management. I asked her to fax lab results.

## 2017-10-30 NOTE — Telephone Encounter (Signed)
Copied from [place] [id number]. Topic: Quick Communication - Rx Refill/Question >> Oct 30, 2017  2:47 PM Scherrie Gerlach wrote: Medication: saxanda Pt states she just spoke with her insurance company and was given info she would like to share with you. Please call pt. Pt states this is concerning a prior auth for this med

## 2017-10-30 NOTE — Patient Instructions (Addendum)
A few things to remember from today's visit:   Hot flashes - Plan: venlafaxine XR (EFFEXOR XR) 37.5 MG 24 hr capsule  Class 2 obesity due to excess calories without serious comorbidity with body mass index (BMI) of 35.0 to 35.9 in adult - Plan: Liraglutide -Weight Management (SAXENDA) 18 MG/3ML SOPN  Effexor at bedtime added for hot flashes.  Please fax lab results of blood work done recently.  Please be sure medication list is accurate. If a new problem present, please set up appointment sooner than planned today.

## 2017-10-30 NOTE — Telephone Encounter (Signed)
Duplicate phone note. See 10/18/17 phone note.

## 2017-10-30 NOTE — Telephone Encounter (Signed)
Message sent to Dr. Jordan for review. 

## 2017-10-31 NOTE — Telephone Encounter (Signed)
She is already following with weight loss clinic and recently placed on Phentermine. I prescribed Saxenda recently upon her request, she was positive it would be covered by her insurance and has been with her in the past. No interactions with her other medications.  I do not feel comfortable prescribing any other weight loss medications at this time.  Thanks, BJ

## 2017-11-02 ENCOUNTER — Other Ambulatory Visit (INDEPENDENT_AMBULATORY_CARE_PROVIDER_SITE_OTHER): Payer: Self-pay | Admitting: Specialist

## 2017-11-03 NOTE — Telephone Encounter (Signed)
Baclofen refill request 

## 2017-11-08 NOTE — Telephone Encounter (Signed)
Spoke with patient and gave instructions per Dr. Jordan. Patient verbalized understanding. 

## 2017-11-08 NOTE — Telephone Encounter (Signed)
Left message to give me a call back concerning Saxenda.

## 2017-11-09 ENCOUNTER — Ambulatory Visit (INDEPENDENT_AMBULATORY_CARE_PROVIDER_SITE_OTHER): Payer: BLUE CROSS/BLUE SHIELD | Admitting: Specialist

## 2017-11-09 ENCOUNTER — Encounter (INDEPENDENT_AMBULATORY_CARE_PROVIDER_SITE_OTHER): Payer: Self-pay

## 2017-11-13 ENCOUNTER — Ambulatory Visit (INDEPENDENT_AMBULATORY_CARE_PROVIDER_SITE_OTHER): Payer: BLUE CROSS/BLUE SHIELD | Admitting: Physical Medicine and Rehabilitation

## 2017-11-13 ENCOUNTER — Ambulatory Visit (INDEPENDENT_AMBULATORY_CARE_PROVIDER_SITE_OTHER): Payer: BLUE CROSS/BLUE SHIELD

## 2017-11-13 ENCOUNTER — Encounter (INDEPENDENT_AMBULATORY_CARE_PROVIDER_SITE_OTHER): Payer: Self-pay | Admitting: Physical Medicine and Rehabilitation

## 2017-11-13 VITALS — BP 161/81 | HR 68

## 2017-11-13 DIAGNOSIS — M5416 Radiculopathy, lumbar region: Secondary | ICD-10-CM

## 2017-11-13 MED ORDER — METHYLPREDNISOLONE ACETATE 80 MG/ML IJ SUSP
80.0000 mg | Freq: Once | INTRAMUSCULAR | Status: AC
Start: 2017-11-13 — End: 2017-11-13
  Administered 2017-11-13: 80 mg

## 2017-11-13 NOTE — Patient Instructions (Signed)

## 2017-11-13 NOTE — Progress Notes (Signed)
 .  Numeric Pain Rating Scale and Functional Assessment Average Pain 7   In the last MONTH (on 0-10 scale) has pain interfered with the following?  1. General activity like being  able to carry out your everyday physical activities such as walking, climbing stairs, carrying groceries, or moving a chair?  Rating(4)   +Driver, -BT, -Dye Allergies.  

## 2017-11-14 NOTE — Progress Notes (Signed)
Jessica Newman - 62 y.o. female MRN [id number]  Date of birth: 08/16/1955  Office Visit Note: Visit Date: 11/13/2017 PCP: Martinique, Betty G, MD Referred by: Martinique, Betty G, MD  Subjective: Chief Complaint  Patient presents with  . Lower Back - Pain  . Left Hip - Pain   HPI: Jessica Newman is a 62 year old female who comes in today at the request of Dr. Rodell Perna for diagnostic and hopefully therapeutic left L4-5 interlaminar epidural steroid injection.  She does have disc protrusion paracentral at L4-5 with some subarticular lateral recess narrowing.  She has had no prior lumbar surgery.  She does report worsening pain when she lies down at night.  She actually is pretty functional during the day.  She has had no red flag complaints of focal weakness or fevers chills or night sweats.  She is failed conservative care otherwise.  We will have her follow-up with Dr. Lorin Mercy in a few weeks after the injection per his request.   ROS Otherwise per HPI.  Assessment & Plan: Visit Diagnoses:  1. Lumbar radiculopathy     Plan: No additional findings.   Meds & Orders:  Meds ordered this encounter  Medications  . methylPREDNISolone acetate (DEPO-MEDROL) injection 80 mg    Orders Placed This Encounter  Procedures  . XR C-ARM NO REPORT  . Epidural Steroid injection    Follow-up: Return in about 2 weeks (around 11/27/2017), or if symptoms worsen or fail to improve, for Dr. Rodell Perna.   Procedures: No procedures performed  Lumbar Epidural Steroid Injection - Interlaminar Approach with Fluoroscopic Guidance  Patient: Jessica Newman      Date of Birth: [birth year] MRN: [id number] PCP: Martinique, Betty G, MD      Visit Date: 11/13/2017   Universal Protocol:     Consent Given By: the patient  Position: PRONE  Additional Comments: Vital signs were monitored before and after the procedure. Patient was prepped and draped in the usual sterile fashion. The correct patient, procedure, and site was  verified.   Injection Procedure Details:  Procedure Site One Meds Administered:  Meds ordered this encounter  Medications  . methylPREDNISolone acetate (DEPO-MEDROL) injection 80 mg     Laterality: Left  Location/Site:  L4-L5  Needle size: 20 G  Needle type: Tuohy  Needle Placement: Paramedian epidural  Findings:   -Comments: Excellent flow of contrast into the epidural space.  Procedure Details: Using a paramedian approach from the side mentioned above, the region overlying the inferior lamina was localized under fluoroscopic visualization and the soft tissues overlying this structure were infiltrated with 4 ml. of 1% Lidocaine without Epinephrine. The Tuohy needle was inserted into the epidural space using a paramedian approach.   The epidural space was localized using loss of resistance along with lateral and bi-planar fluoroscopic views.  After negative aspirate for air, blood, and CSF, a 2 ml. volume of Isovue-250 was injected into the epidural space and the flow of contrast was observed. Radiographs were obtained for documentation purposes.    The injectate was administered into the level noted above.   Additional Comments:  The patient tolerated the procedure well Dressing: Band-Aid    Post-procedure details: Patient was observed during the procedure. Post-procedure instructions were reviewed.  Patient left the clinic in stable condition.   Clinical History: MRI LUMBAR SPINE WITHOUT CONTRAST  TECHNIQUE: Multiplanar, multisequence MR imaging of the lumbar spine was performed. No intravenous contrast was administered.  COMPARISON:  None.  FINDINGS: Segmentation:  5 lumbar type vertebral bodies.  Alignment:  Borderline levocurvature.  Vertebrae: No fracture, evidence of discitis, or aggressive bone lesion. Small hemangiomas in the T12, L2, and L3 bodies.  Conus medullaris and cauda equina: Conus extends to the L1 level. Conus and cauda equina  appear normal.  Paraspinal and other soft tissues: Negative  Disc levels:  T12- L1: Unremarkable.  L1-L2: Spondylosis and mild disc narrowing.  No impingement  L2-L3: Spondylosis and mild disc narrowing. Mild degenerative facet spurring. No impingement  L3-L4: Spondylosis with mild disc bulging. Mild facet spurring. No impingement  L4-L5: Disc narrowing and bulging with left paracentral protrusion best seen on reformats. Degenerative posterior element hypertrophy. Mild-to-moderate spinal stenosis. Left more than right subarticular recess impingement on L5. Patent foramina  L5-S1:Degenerative facet spurring.  IMPRESSION: 1. Generalized overall mild disc degeneration. Degenerative facet spurring at L2-3 and below. 2. L4-5 mild to moderate spinal stenosis. Either L5 nerve root could be affected in the subarticular recesses, especially on the left where there is a paracentral disc protrusion. 3. No impingement elsewhere in the lumbar spine.   Electronically Signed   By: Monte Fantasia M.D.   On: 09/26/2017 08:21   She reports that she has never smoked. She has never used smokeless tobacco. No results for input(s): HGBA1C, LABURIC in the last 8760 hours.  Objective:  VS:  HT:    WT:   BMI:     BP:(!) 161/81  HR:68bpm  TEMP: ( )  RESP:  Physical Exam  Ortho Exam Imaging: Xr C-arm No Report  Result Date: 11/13/2017 Please see Notes or Procedures tab for imaging impression.   Past Medical/Family/Surgical/Social History: Medications & Allergies reviewed per EMR, new medications updated. Patient Active Problem List   Diagnosis Date Noted  . Hypothyroidism 10/30/2017  . History of basal cell carcinoma 08/16/2017  . 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 Class 2 obesity with body mass index (BMI) of 35.0 to 35.9 in adult 08/02/2017  . Chronic low back pain 08/02/2017   Past Medical History:  Diagnosis Date   . Allergy   . Arthritis   . Cancer ([place])   . Chicken pox   . Depression   . Diverticulitis   . GERD (gastroesophageal reflux disease)   . Heart murmur   . Hyperlipidemia   . Scarlet fever   . Thyroid condition    History reviewed. No pertinent family history. Past Surgical History:  Procedure Laterality Date  . APPENDECTOMY  1976  . BREAST SURGERY  2004  . NOSE SURGERY  11/2016   Social History   Occupational History  . Not on file  Tobacco Use  . Smoking status: Never Smoker  . Smokeless tobacco: Never Used  Substance and Sexual Activity  . Alcohol use: Yes  . Drug use: No  . Sexual activity: Never

## 2017-11-14 NOTE — Procedures (Signed)
Lumbar Epidural Steroid Injection - Interlaminar Approach with Fluoroscopic Guidance  Patient: Jessica Newman      Date of Birth: [birth year] MRN: [id number] PCP: Martinique, Betty G, MD      Visit Date: 11/13/2017   Universal Protocol:     Consent Given By: the patient  Position: PRONE  Additional Comments: Vital signs were monitored before and after the procedure. Patient was prepped and draped in the usual sterile fashion. The correct patient, procedure, and site was verified.   Injection Procedure Details:  Procedure Site One Meds Administered:  Meds ordered this encounter  Medications  . methylPREDNISolone acetate (DEPO-MEDROL) injection 80 mg     Laterality: Left  Location/Site:  L4-L5  Needle size: 20 G  Needle type: Tuohy  Needle Placement: Paramedian epidural  Findings:   -Comments: Excellent flow of contrast into the epidural space.  Procedure Details: Using a paramedian approach from the side mentioned above, the region overlying the inferior lamina was localized under fluoroscopic visualization and the soft tissues overlying this structure were infiltrated with 4 ml. of 1% Lidocaine without Epinephrine. The Tuohy needle was inserted into the epidural space using a paramedian approach.   The epidural space was localized using loss of resistance along with lateral and bi-planar fluoroscopic views.  After negative aspirate for air, blood, and CSF, a 2 ml. volume of Isovue-250 was injected into the epidural space and the flow of contrast was observed. Radiographs were obtained for documentation purposes.    The injectate was administered into the level noted above.   Additional Comments:  The patient tolerated the procedure well Dressing: Band-Aid    Post-procedure details: Patient was observed during the procedure. Post-procedure instructions were reviewed.  Patient left the clinic in stable condition.

## 2017-11-23 ENCOUNTER — Other Ambulatory Visit: Payer: Self-pay | Admitting: Family Medicine

## 2017-11-24 ENCOUNTER — Other Ambulatory Visit: Payer: Self-pay | Admitting: Family Medicine

## 2017-11-27 ENCOUNTER — Telehealth: Payer: Self-pay | Admitting: *Deleted

## 2017-11-27 NOTE — Telephone Encounter (Signed)
Refill request for Effexor HCL ER 37.5 mg cap

## 2017-11-28 ENCOUNTER — Other Ambulatory Visit: Payer: Self-pay | Admitting: Family Medicine

## 2017-11-28 ENCOUNTER — Other Ambulatory Visit (INDEPENDENT_AMBULATORY_CARE_PROVIDER_SITE_OTHER): Payer: Self-pay | Admitting: Specialist

## 2017-11-28 DIAGNOSIS — R232 Flushing: Secondary | ICD-10-CM

## 2017-11-28 MED ORDER — VENLAFAXINE HCL ER 37.5 MG PO CP24: 37.5000 mg | ORAL_CAPSULE | Freq: Every day | ORAL | 0 refills | Status: AC

## 2017-11-28 NOTE — Telephone Encounter (Signed)
Prescription for Effexor ER 37.5 mg was sent to her pharmacy. Thanks, BJ

## 2017-11-28 NOTE — Telephone Encounter (Signed)
Patient informed that Rx was sent to pharmacy. 

## 2017-11-29 NOTE — Telephone Encounter (Signed)
Baclofen refill request 

## 2017-12-06 ENCOUNTER — Ambulatory Visit (INDEPENDENT_AMBULATORY_CARE_PROVIDER_SITE_OTHER): Payer: BLUE CROSS/BLUE SHIELD | Admitting: Orthopaedic Surgery

## 2017-12-06 ENCOUNTER — Encounter (INDEPENDENT_AMBULATORY_CARE_PROVIDER_SITE_OTHER): Payer: Self-pay | Admitting: Orthopaedic Surgery

## 2017-12-06 VITALS — BP 124/80 | HR 86 | Ht 65.0 in | Wt 226.0 lb

## 2017-12-06 DIAGNOSIS — M48061 Spinal stenosis, lumbar region without neurogenic claudication: Secondary | ICD-10-CM | POA: Diagnosis not present

## 2017-12-06 NOTE — Progress Notes (Signed)
Office Visit Note   Patient: Jessica Newman           Date of Birth: 07-21-1955           MRN: [id number] Visit Date: 12/06/2017              Requested by: Martinique, Betty G, MD [address] [place], [place] [address] PCP: Martinique, Betty G, MD   Assessment & Plan: Visit Diagnoses:  1. Spinal stenosis of lumbar region without neurogenic claudication     Plan: Patient has some moderate L4-5 stenosis with disc protrusion.  She is gotten good relief with epidural.  She will continue to work on weight loss and a walking program.  We reviewed MRI imaging with her again today.  I will check her back again on an as-needed basis and if she develops radicular symptoms or symptoms of neurogenic claudication which we reviewed with her today she will call.  Patient is very happy with the results of the epidural.  Follow-Up Instructions: No follow-ups on file.   Orders:  No orders of the defined types were placed in this encounter.  No orders of the defined types were placed in this encounter.     Procedures: No procedures performed   Clinical Data: No additional findings.   Subjective: Chief Complaint  Patient presents with  . Lower Back - Pain, Follow-up    Post Lumbar ESI    HPI 62 year old female returns post epidural on 11/13/2017 with very good relief of her pain.  She states she is only occasionally had mild discomfort in her right buttocks.  She states she feels like the epidural was almost a miracle giving her relief and she is gotten on a diet program she got from [facility] and has lost 5 pounds.  She started a walking program which we have encouraged.  No bowel or bladder symptoms no chills or fever.  Review of Systems 14 point review of systems updated unchanged from 08/25/2017 office note, other than as mentioned in HPI.   Objective: Vital Signs: BP 124/80   Pulse 86   Ht 5\' 5"  (1.651 m)   Wt 226 lb (102.5 kg)   BMI 37.61 kg/m   Physical Exam  Constitutional: She  is oriented to person, place, and time. She appears well-developed.  HENT:  Head: Normocephalic.  Right Ear: External ear normal.  Left Ear: External ear normal.  Eyes: Pupils are equal, round, and reactive to light.  Neck: No tracheal deviation present. No thyromegaly present.  Cardiovascular: Normal rate.  Pulmonary/Chest: Effort normal.  Abdominal: Soft.  Neurological: She is alert and oriented to person, place, and time.  Skin: Skin is warm and dry.  Psychiatric: She has a normal mood and affect. Her behavior is normal.    Ortho Exam normal heel toe gait negative straight leg raising 90 degrees normal hip range of motion.  Slight right sciatic notch tenderness no tenderness on the left no trochanteric bursal tenderness.  Quads are strong knees reach full extension.  Negative Trendelenburg gait.  Specialty Comments:  No specialty comments available.  Imaging: No results found.   PMFS History: Patient Active Problem List   Diagnosis Date Noted  . Hypothyroidism 10/30/2017  . History of basal cell carcinoma 08/16/2017  . 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 Class 2 obesity with body mass index (BMI) of 35.0 to 35.9 in adult 08/02/2017  . Chronic low back pain 08/02/2017  Past Medical History:  Diagnosis Date  . Allergy   . Arthritis   . Cancer ([place])   . Chicken pox   . Depression   . Diverticulitis   . GERD (gastroesophageal reflux disease)   . Heart murmur   . Hyperlipidemia   . Scarlet fever   . Thyroid condition     No family history on file.  Past Surgical History:  Procedure Laterality Date  . APPENDECTOMY  1976  . BREAST SURGERY  2004  . NOSE SURGERY  11/2016   Social History   Occupational History  . Not on file  Tobacco Use  . Smoking status: Never Smoker  . Smokeless tobacco: Never Used  Substance and Sexual Activity  . Alcohol use: Yes  . Drug use: No  . Sexual activity: Never

## 2017-12-11 ENCOUNTER — Telehealth: Payer: Self-pay | Admitting: *Deleted

## 2017-12-11 NOTE — Telephone Encounter (Signed)
Refill request for Quetiapine 100 mg tab by mouth every day at bedtime Last refill: 08/30/17 Last ov: 08/02/17

## 2017-12-16 ENCOUNTER — Other Ambulatory Visit (INDEPENDENT_AMBULATORY_CARE_PROVIDER_SITE_OTHER): Payer: Self-pay | Admitting: Specialist

## 2017-12-18 NOTE — Telephone Encounter (Signed)
Baclofen refill request 

## 2017-12-19 ENCOUNTER — Other Ambulatory Visit (INDEPENDENT_AMBULATORY_CARE_PROVIDER_SITE_OTHER): Payer: Self-pay | Admitting: Specialist

## 2017-12-19 MED ORDER — DICLOFENAC SODIUM 75 MG PO TBEC: 75.0000 mg | DELAYED_RELEASE_TABLET | Freq: Two times a day (BID) | ORAL | 2 refills | Status: DC

## 2017-12-19 MED ORDER — BACLOFEN 10 MG PO TABS: 10.0000 mg | ORAL_TABLET | Freq: Three times a day (TID) | ORAL | 2 refills | Status: AC

## 2017-12-19 NOTE — Telephone Encounter (Signed)
Patient called to request a refill on:  Baclofen 10mg  Diclofenac  Please call patient to advise [phone]

## 2018-01-10 ENCOUNTER — Other Ambulatory Visit (INDEPENDENT_AMBULATORY_CARE_PROVIDER_SITE_OTHER): Payer: Self-pay | Admitting: Specialist

## 2018-01-10 NOTE — Telephone Encounter (Signed)
Baclofen refill request 

## 2018-01-30 ENCOUNTER — Ambulatory Visit: Payer: BLUE CROSS/BLUE SHIELD | Admitting: Family Medicine

## 2018-03-21 ENCOUNTER — Other Ambulatory Visit: Payer: Self-pay

## 2018-03-21 MED ORDER — LAMOTRIGINE 150 MG PO TABS: 150.0000 mg | ORAL_TABLET | Freq: Every day | ORAL | 1 refills | Status: DC

## 2018-04-09 ENCOUNTER — Other Ambulatory Visit: Payer: Self-pay

## 2018-04-09 MED ORDER — LAMOTRIGINE 150 MG PO TABS: 150.0000 mg | ORAL_TABLET | Freq: Every day | ORAL | 0 refills | Status: AC

## 2018-05-01 ENCOUNTER — Other Ambulatory Visit: Payer: Self-pay | Admitting: Family Medicine

## 2018-05-25 ENCOUNTER — Other Ambulatory Visit (INDEPENDENT_AMBULATORY_CARE_PROVIDER_SITE_OTHER): Payer: Self-pay | Admitting: Orthopaedic Surgery

## 2018-05-25 NOTE — Telephone Encounter (Signed)
Ok for refill? 

## 2018-07-03 ENCOUNTER — Other Ambulatory Visit (INDEPENDENT_AMBULATORY_CARE_PROVIDER_SITE_OTHER): Payer: Self-pay | Admitting: Specialist

## 2018-07-03 MED ORDER — BACLOFEN 10 MG PO TABS: 10.0000 mg | ORAL_TABLET | Freq: Three times a day (TID) | ORAL | 0 refills | Status: AC

## 2018-07-03 NOTE — Telephone Encounter (Signed)
Patient called to get refill for [facility].  Please call patient to advise. [phone]

## 2018-07-03 NOTE — Telephone Encounter (Signed)
Sent request to Dr. Nitka 

## 2018-11-19 ENCOUNTER — Other Ambulatory Visit (INDEPENDENT_AMBULATORY_CARE_PROVIDER_SITE_OTHER): Payer: Self-pay | Admitting: Orthopaedic Surgery

## 2018-11-19 NOTE — Telephone Encounter (Signed)
Please advise 

## 2019-01-08 ENCOUNTER — Other Ambulatory Visit: Payer: Self-pay | Admitting: Family Medicine

## 2019-01-09 NOTE — Telephone Encounter (Signed)
Patient need to schedule an ov for more refills. Rx will be refilled for a month. Lm informing pt of update.

## 2019-02-10 ENCOUNTER — Other Ambulatory Visit (INDEPENDENT_AMBULATORY_CARE_PROVIDER_SITE_OTHER): Payer: Self-pay | Admitting: Orthopaedic Surgery

## 2019-02-11 NOTE — Telephone Encounter (Signed)
Please advise 

## 2019-04-06 ENCOUNTER — Other Ambulatory Visit (INDEPENDENT_AMBULATORY_CARE_PROVIDER_SITE_OTHER): Payer: Self-pay | Admitting: Orthopaedic Surgery

## 2019-04-07 NOTE — Telephone Encounter (Signed)
Ok to rf? 

## 2019-04-10 NOTE — Telephone Encounter (Signed)
No refill.  Last office visit June 2019.  Needs ROV with Dr. Lorin Mercy

## 2019-05-07 ENCOUNTER — Telehealth: Payer: Self-pay | Admitting: Specialist

## 2019-05-07 NOTE — Telephone Encounter (Signed)
Received vm from patient needing records be sent to another doctor in [place]. IC,advised her need to complete records release form. I emailed to her per her [email or web address] is [phone]

## 2019-06-16 IMAGING — MR MR LUMBAR SPINE W/O CM
4 of 5 series · 26 of 48 positions shown · non-contrast
Comparison: None.

CLINICAL DATA: Low back pain and stiffness. Symptoms for 3 years.
Left hip pain. Abnormal x-ray.

EXAM:
MRI LUMBAR SPINE WITHOUT CONTRAST
TECHNIQUE: Multiplanar, multisequence MR imaging of the lumbar spine was
performed. No intravenous contrast was administered.

[Series 4: T1 · sagittal · 4.0mm · 0.55mm/px · 5 of 13 slices shown (1 of 2)]
[im 1/13]
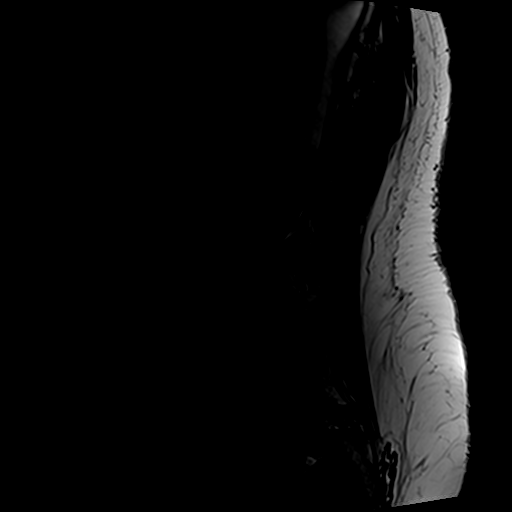
[im 4/13]
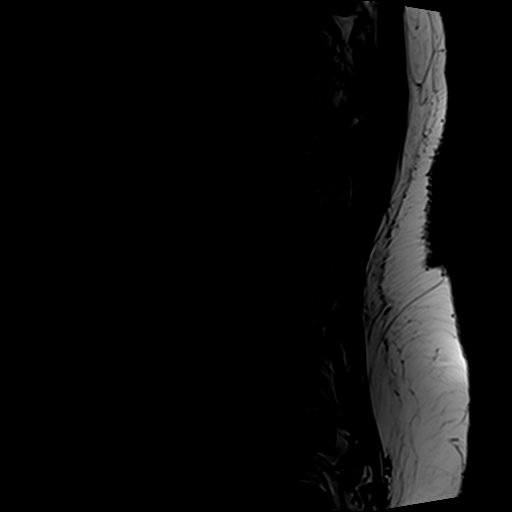
[im 7/13]
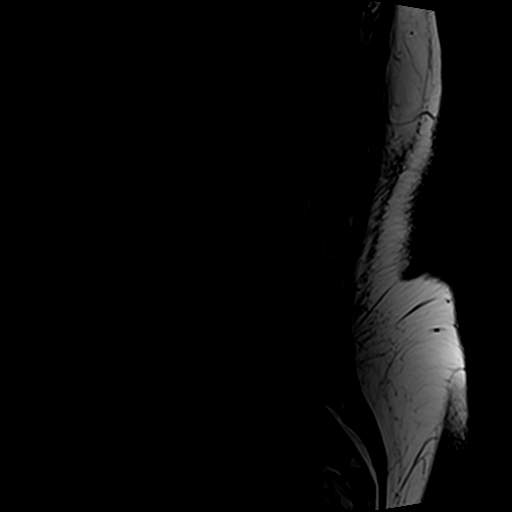
[im 10/13]
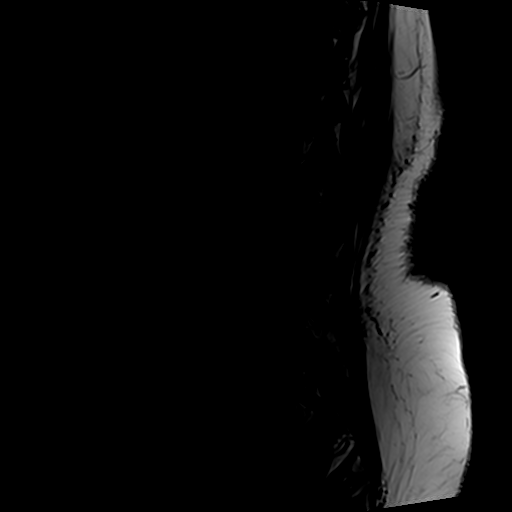
[im 13/13]
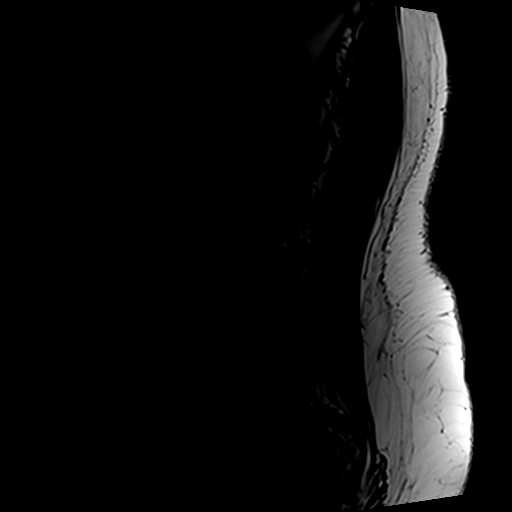

[Series 5: T2 post-contrast · sagittal · 4.0mm · 0.55mm/px · 6 of 13 slices shown]
[im 1/13]
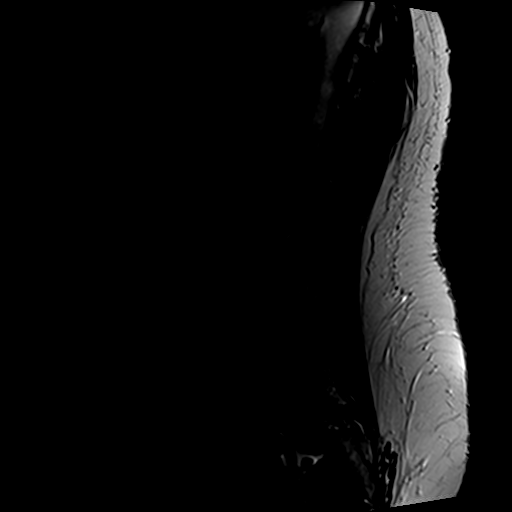
[im 3/13]
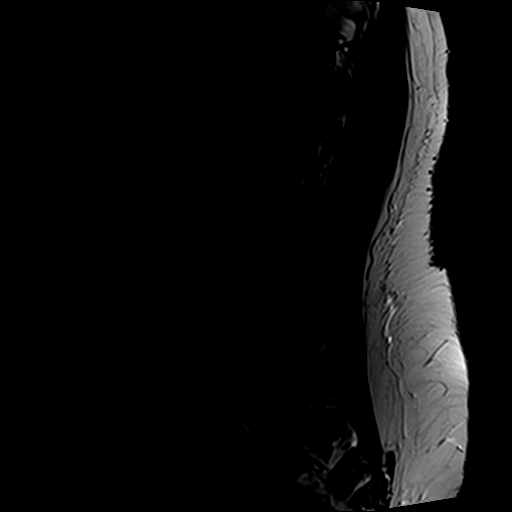
[im 5/13]
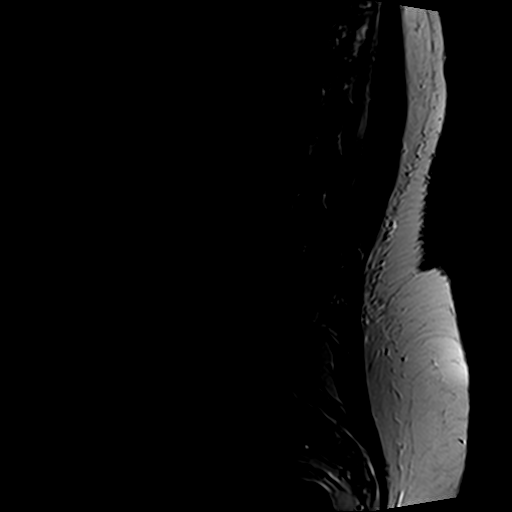
[im 8/13]
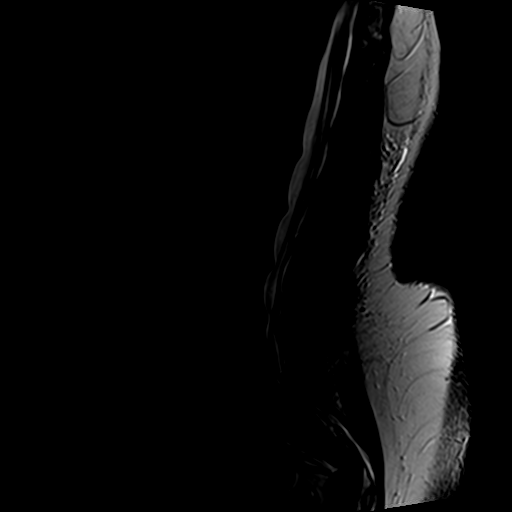
[im 10/13]
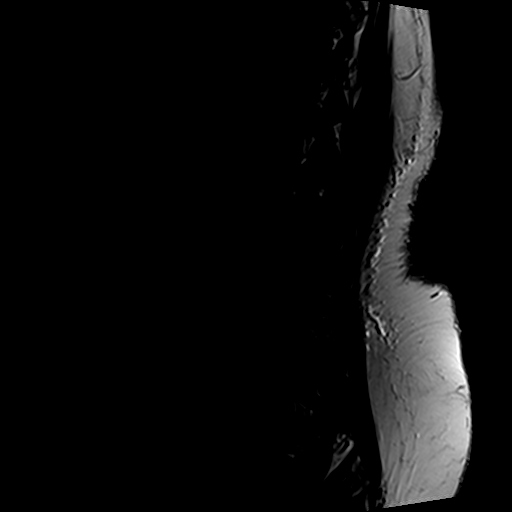
[im 13/13]
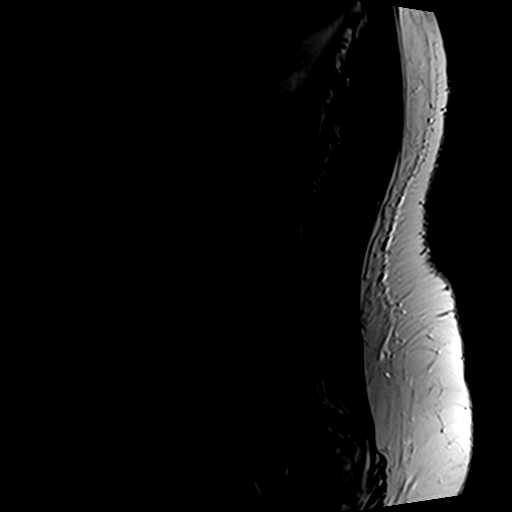

[Series 6: T2 · axial · 4.0mm · 0.70mm/px · z∈[+18,+207]mm · 10 of 37 slices shown]
[im 3/37]
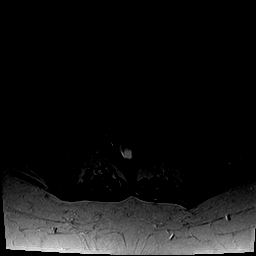
[im 5/37]
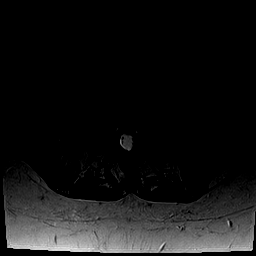
[im 8/37]
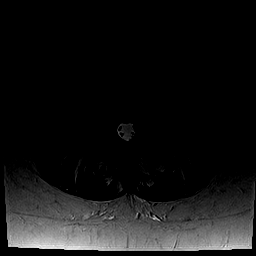
[im 13/37]
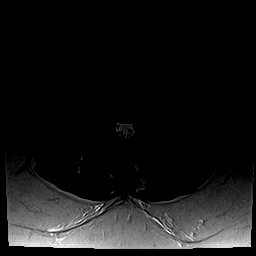
[im 17/37]
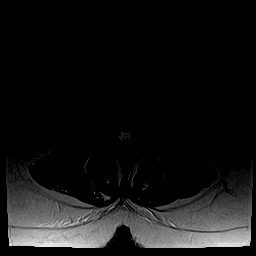
[im 20/37]
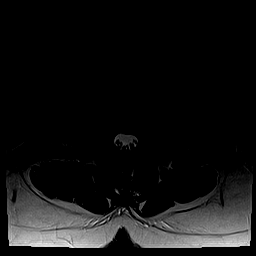
[im 22/37]
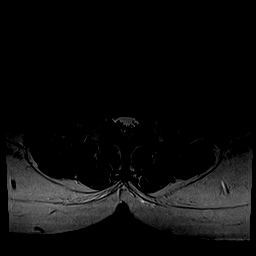
[im 27/37]
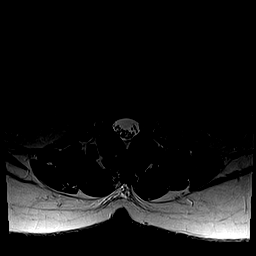
[im 32/37]
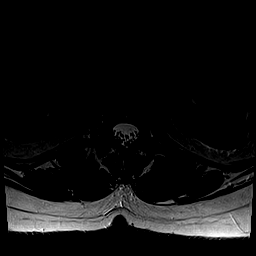
[im 37/37]
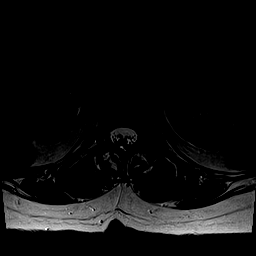

[Series 7: T1 · axial · 4.0mm · 0.35mm/px · z∈[+18,+182]mm · 5 of 37 slices shown (2 of 2)]
[im 3/37]
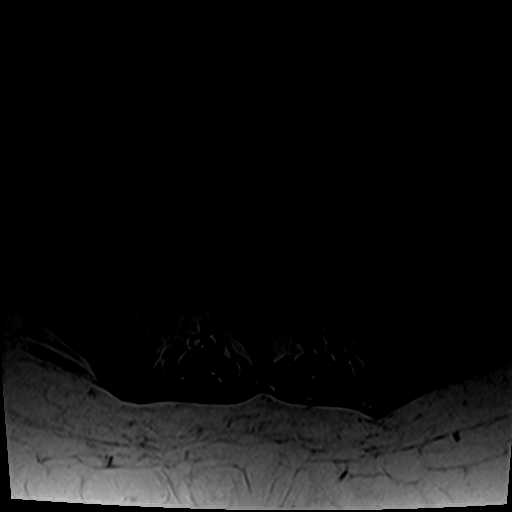
[im 5/37]
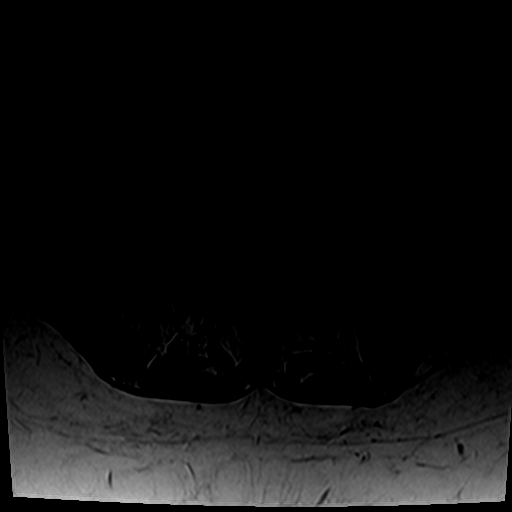
[im 8/37]
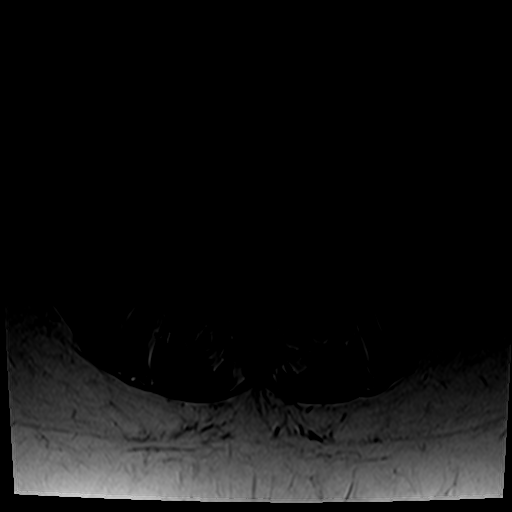
[im 20/37]
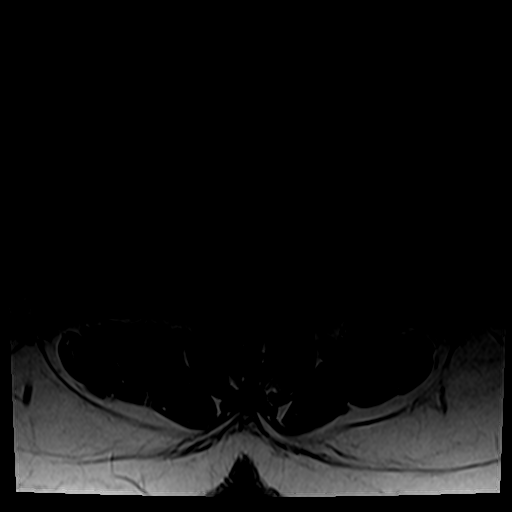
[im 32/37]
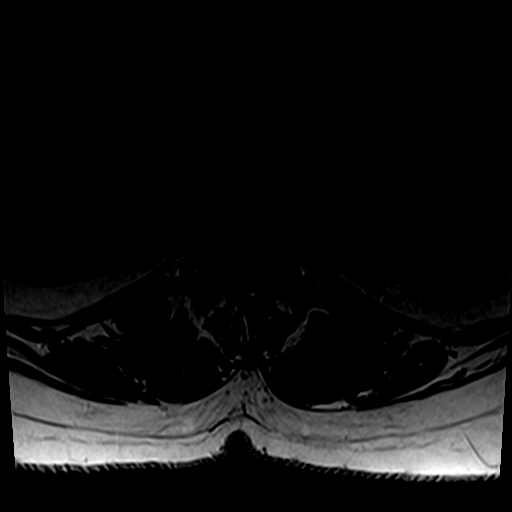

[26 of 48 positions shown; findings below may reference images not displayed]

FINDINGS: Segmentation:  5 lumbar type vertebral bodies.

Alignment:  Borderline levocurvature.

Vertebrae: No fracture, evidence of discitis, or aggressive bone
lesion. Small hemangiomas in the T12, L2, and L3 bodies.

Conus medullaris and cauda equina: Conus extends to the L1 level.
Conus and cauda equina appear normal.

Paraspinal and other soft tissues: Negative

Disc levels:

T12- L1: Unremarkable.

L1-L2: Spondylosis and mild disc narrowing.  No impingement

L2-L3: Spondylosis and mild disc narrowing. Mild degenerative facet
spurring. No impingement

L3-L4: Spondylosis with mild disc bulging. Mild facet spurring. No
impingement

L4-L5: Disc narrowing and bulging with left paracentral protrusion
best seen on reformats. Degenerative posterior element hypertrophy.
Mild-to-moderate spinal stenosis. Left more than right subarticular
recess impingement on L5. Patent foramina

L5-S1:Degenerative facet spurring.
IMPRESSION: 1. Generalized overall mild disc degeneration. Degenerative facet
spurring at L2-3 and below.
2. L4-5 mild to moderate spinal stenosis. Either L5 nerve root could
be affected in the subarticular recesses, especially on the left
where there is a paracentral disc protrusion.
3. No impingement elsewhere in the lumbar spine.
